# Patient Record
Sex: Female | Born: 1965
Health system: Southern US, Community
[De-identification: ages and names within clinical notes are randomized; demographics above are authoritative.]

## PROBLEM LIST (undated history)

## (undated) DIAGNOSIS — O00109 Unspecified tubal pregnancy without intrauterine pregnancy: Secondary | ICD-10-CM

## (undated) HISTORY — PX: ROTATOR CUFF REPAIR: SHX139

## (undated) HISTORY — PX: SALPINGECTOMY: SHX328

---

## 2000-12-10 ENCOUNTER — Other Ambulatory Visit: Admission: RE | Admit: 2000-12-10 | Discharge: 2000-12-10 | Payer: Self-pay | Admitting: Obstetrics and Gynecology

## 2002-02-06 ENCOUNTER — Other Ambulatory Visit: Admission: RE | Admit: 2002-02-06 | Discharge: 2002-02-06 | Payer: Self-pay | Admitting: Family Medicine

## 2003-07-02 ENCOUNTER — Emergency Department (HOSPITAL_COMMUNITY): Admission: EM | Admit: 2003-07-02 | Discharge: 2003-07-02 | Payer: Self-pay | Admitting: Family Medicine

## 2003-09-01 ENCOUNTER — Other Ambulatory Visit: Admission: RE | Admit: 2003-09-01 | Discharge: 2003-09-01 | Payer: Self-pay | Admitting: Obstetrics & Gynecology

## 2004-09-06 ENCOUNTER — Other Ambulatory Visit: Admission: RE | Admit: 2004-09-06 | Discharge: 2004-09-06 | Payer: Self-pay | Admitting: Obstetrics & Gynecology

## 2005-01-28 ENCOUNTER — Emergency Department (HOSPITAL_COMMUNITY): Admission: EM | Admit: 2005-01-28 | Discharge: 2005-01-28 | Payer: Self-pay | Admitting: Family Medicine

## 2005-07-09 ENCOUNTER — Emergency Department (HOSPITAL_COMMUNITY): Admission: EM | Admit: 2005-07-09 | Discharge: 2005-07-09 | Payer: Self-pay | Admitting: Family Medicine

## 2006-11-09 ENCOUNTER — Encounter (INDEPENDENT_AMBULATORY_CARE_PROVIDER_SITE_OTHER): Payer: Self-pay | Admitting: Obstetrics & Gynecology

## 2006-11-09 ENCOUNTER — Ambulatory Visit (HOSPITAL_COMMUNITY): Admission: RE | Admit: 2006-11-09 | Discharge: 2006-11-09 | Payer: Self-pay | Admitting: Obstetrics & Gynecology

## 2007-04-10 ENCOUNTER — Encounter: Admission: RE | Admit: 2007-04-10 | Discharge: 2007-04-10 | Payer: Self-pay | Admitting: Obstetrics & Gynecology

## 2008-01-31 ENCOUNTER — Emergency Department (HOSPITAL_COMMUNITY): Admission: EM | Admit: 2008-01-31 | Discharge: 2008-01-31 | Payer: Self-pay | Admitting: Family Medicine

## 2008-11-17 ENCOUNTER — Encounter: Admission: RE | Admit: 2008-11-17 | Discharge: 2008-11-17 | Payer: Self-pay | Admitting: Obstetrics & Gynecology

## 2009-04-09 ENCOUNTER — Emergency Department (HOSPITAL_COMMUNITY): Admission: EM | Admit: 2009-04-09 | Discharge: 2009-04-09 | Payer: Self-pay | Admitting: Emergency Medicine

## 2009-07-21 ENCOUNTER — Encounter: Admission: RE | Admit: 2009-07-21 | Discharge: 2009-07-21 | Payer: Self-pay | Admitting: Obstetrics & Gynecology

## 2010-09-27 NOTE — Op Note (Signed)
Holly Robertson, ANGEVINE               ACCOUNT NO.:  [id number]   MEDICAL RECORD NO.:  [id number]          PATIENT TYPE:  AMB   LOCATION:  SDC                           FACILITY:  [facility]   PHYSICIAN:  Ilda Mori, M.D.   DATE OF BIRTH:  30-Jul-1965   DATE OF PROCEDURE:  11/09/2006  DATE OF DISCHARGE:                               OPERATIVE REPORT   PREOPERATIVE DIAGNOSES:  Menorrhagia and submucous myoma.   POSTOPERATIVE DIAGNOSES:  Menorrhagia and submucous myoma.   PROCEDURE:  Hysteroscopic myomectomy and NovaSure endometrial ablation.   SURGEON:  Ilda Mori, M.D.   ANESTHESIA:  General.   ESTIMATED BLOOD LOSS:  Minimal.   SPECIMENS:  Tissue from submucous myoma.   FINDINGS:  The uterus sounded to 12 cm.  The cervix sounded to 4 cm.  The hysteroscopic evaluation revealed a 2 cm posterior submucous myoma  and external compression of the endometrium from the fundus.  The  endometrial tissue otherwise appeared normal.   INDICATIONS:  This is a 45 year old female, gravida 5, para 3-0-2-3, who  presents with several years of heavy vaginal bleeding that does not  respond well, even to oral contraceptives.  Sonohysterogram in the  office revealed a submucous myoma and this finding was discussed with  the patient and decision was made to proceed with hysteroscopy with  resection of the submucous myoma and endometrial ablation.   DESCRIPTION OF PROCEDURE:  The patient was taken to the operating room  and placed in the supine position and general anesthesia was induced.  She was then placed in the dorsal lithotomy position and the vagina and  perineum were prepped and draped in sterile fashion.  The anterior lip  of the cervix was grasped with a single-toothed tenaculum and 10 mL of  2% lidocaine was infiltrated into the paracervical tissues.  The cervix  was sounded to 4 cm with a Hegar dilator and the uterus was sounded to  12 cm.  The internal os was dilated with Shawnie Pons dilators  to a #25 [place].  A resectoscope was introduced into the endometrium and the submucous  myoma was identified and superficially resected, using cutting current  and a large loop electrode.  Care was made not to go into the  myometrium.  After the submucous myoma was resected down to the level of  the endometrium, the cavity was irrigated free of the Sorbitol solution  with lactated Ringer's solution.  The NovaSure device was placed at the  fundus and expanded to 4.2 cm and the ablation was carried out for  approximately 60 seconds.  The NovaSure was then  removed.  The diagnostic hysteroscope was introduced back into the  endometrial cavity and a satisfactory coagulation was noted to be  present.  The procedure was then terminated and the patient left the  operating room in good condition.      Ilda Mori, M.D.  Electronically Signed     RK/MEDQ  D:  11/09/2006  T:  11/09/2006  Job:  [id number]

## 2010-09-28 ENCOUNTER — Other Ambulatory Visit: Payer: Self-pay | Admitting: Obstetrics & Gynecology

## 2010-09-28 DIAGNOSIS — Z1231 Encounter for screening mammogram for malignant neoplasm of breast: Secondary | ICD-10-CM

## 2010-10-05 ENCOUNTER — Ambulatory Visit
Admission: RE | Admit: 2010-10-05 | Discharge: 2010-10-05 | Disposition: A | Discharge status: Home / Self Care | Payer: 59 | Source: Ambulatory Visit | Attending: Obstetrics & Gynecology | Admitting: Obstetrics & Gynecology

## 2010-10-05 DIAGNOSIS — Z1231 Encounter for screening mammogram for malignant neoplasm of breast: Secondary | ICD-10-CM

## 2010-10-06 ENCOUNTER — Other Ambulatory Visit: Payer: Self-pay | Admitting: Obstetrics & Gynecology

## 2010-10-06 DIAGNOSIS — R928 Other abnormal and inconclusive findings on diagnostic imaging of breast: Secondary | ICD-10-CM

## 2010-10-14 ENCOUNTER — Ambulatory Visit
Admission: RE | Admit: 2010-10-14 | Discharge: 2010-10-14 | Disposition: A | Discharge status: Home / Self Care | Payer: 59 | Source: Ambulatory Visit | Attending: Obstetrics & Gynecology | Admitting: Obstetrics & Gynecology

## 2010-10-14 DIAGNOSIS — R928 Other abnormal and inconclusive findings on diagnostic imaging of breast: Secondary | ICD-10-CM

## 2011-02-27 ENCOUNTER — Other Ambulatory Visit: Payer: Self-pay | Admitting: Emergency Medicine

## 2011-02-27 ENCOUNTER — Ambulatory Visit
Admission: RE | Admit: 2011-02-27 | Discharge: 2011-02-27 | Disposition: A | Discharge status: Home / Self Care | Payer: 59 | Source: Ambulatory Visit | Attending: Emergency Medicine | Admitting: Emergency Medicine

## 2011-02-27 DIAGNOSIS — G8929 Other chronic pain: Secondary | ICD-10-CM

## 2011-03-01 LAB — CBC
MCHC: 33.7
Platelets: 220
RDW: 12.5
WBC: 5.5

## 2011-05-02 ENCOUNTER — Other Ambulatory Visit: Payer: Self-pay | Admitting: Obstetrics & Gynecology

## 2011-05-02 DIAGNOSIS — N6310 Unspecified lump in the right breast, unspecified quadrant: Secondary | ICD-10-CM

## 2011-05-15 ENCOUNTER — Ambulatory Visit
Admission: RE | Admit: 2011-05-15 | Discharge: 2011-05-15 | Disposition: A | Discharge status: Home / Self Care | Payer: 59 | Source: Ambulatory Visit | Attending: Obstetrics & Gynecology | Admitting: Obstetrics & Gynecology

## 2011-05-15 DIAGNOSIS — N6310 Unspecified lump in the right breast, unspecified quadrant: Secondary | ICD-10-CM

## 2012-02-21 ENCOUNTER — Other Ambulatory Visit: Payer: Self-pay | Admitting: Obstetrics & Gynecology

## 2012-02-21 DIAGNOSIS — Z1231 Encounter for screening mammogram for malignant neoplasm of breast: Secondary | ICD-10-CM

## 2012-02-28 ENCOUNTER — Ambulatory Visit
Admission: RE | Admit: 2012-02-28 | Discharge: 2012-02-28 | Disposition: A | Discharge status: Home / Self Care | Payer: 59 | Source: Ambulatory Visit | Attending: Obstetrics & Gynecology | Admitting: Obstetrics & Gynecology

## 2012-02-28 DIAGNOSIS — Z1231 Encounter for screening mammogram for malignant neoplasm of breast: Secondary | ICD-10-CM

## 2012-08-06 ENCOUNTER — Other Ambulatory Visit: Payer: Self-pay

## 2012-12-21 ENCOUNTER — Emergency Department (HOSPITAL_COMMUNITY)
Admission: EM | Admit: 2012-12-21 | Discharge: 2012-12-21 | Disposition: A | Discharge status: Home / Self Care | Payer: 59 | Source: Home / Self Care | Attending: Family Medicine | Admitting: Family Medicine

## 2012-12-21 ENCOUNTER — Encounter (HOSPITAL_COMMUNITY): Payer: Self-pay | Admitting: Emergency Medicine

## 2012-12-21 DIAGNOSIS — S39012A Strain of muscle, fascia and tendon of lower back, initial encounter: Secondary | ICD-10-CM

## 2012-12-21 DIAGNOSIS — S335XXA Sprain of ligaments of lumbar spine, initial encounter: Secondary | ICD-10-CM

## 2012-12-21 DIAGNOSIS — M5431 Sciatica, right side: Secondary | ICD-10-CM

## 2012-12-21 DIAGNOSIS — M5430 Sciatica, unspecified side: Secondary | ICD-10-CM

## 2012-12-21 LAB — POCT URINALYSIS DIP (DEVICE): Nitrite: NEGATIVE

## 2012-12-21 MED ORDER — TRAMADOL HCL 50 MG PO TABS: 50.0000 mg | ORAL_TABLET | Freq: Four times a day (QID) | ORAL | Status: DC | PRN

## 2012-12-21 MED ORDER — IBUPROFEN 600 MG PO TABS: 600.0000 mg | ORAL_TABLET | Freq: Three times a day (TID) | ORAL | Status: DC

## 2012-12-21 MED ORDER — CYCLOBENZAPRINE HCL 10 MG PO TABS: 10.0000 mg | ORAL_TABLET | Freq: Two times a day (BID) | ORAL | Status: DC | PRN

## 2012-12-21 MED ORDER — PREDNISONE 20 MG PO TABS: ORAL_TABLET | ORAL | Status: DC

## 2012-12-21 NOTE — ED Notes (Signed)
Pt c/o right lower back pain that radiates down to right leg onset 1 week sxs include: tingly/numbness; pain increases when lying down or getting out of bed... Throughout the day she is fine but doesn't know if it's the aleve/ibup she's taking Denies: inj/trauma, strenuous activity, urinary sxs, abd pain Alert w/no signs of acute distress.

## 2012-12-21 NOTE — ED Provider Notes (Signed)
And CSN: [id number]     Arrival date & time 12/21/12  4098 History     First MD Initiated Contact with Patient 12/21/12 0957     Chief Complaint  Patient presents with  . Back Pain   (Consider location/radiation/quality/duration/timing/severity/associated sxs/prior Treatment) HPI Comments: 47 year old female with no significant past medical history here complaining of low back pain with radiation towards the right gluteal area and right lateral thigh for one week. Symptoms associated with a tingling sensation in her lateral right thigh especially after laying down or when she stands up from bed or from prolonged sitting. 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 Denies weakness or numbness in her lower extremities. Denies urinary symptoms like frequency, hematuria or incontinence. Has taken in inconsistent over-the-counter Advil with improvement of her pain during daytime. Denies direct known trauma or recent falls.   History reviewed. No pertinent past medical history. Past Surgical History  Procedure Laterality Date  . Rotator cuff repair Left    No family history on file. History  Substance Use Topics  . Smoking status: Never Smoker   . Smokeless tobacco: Not on file  . Alcohol Use: No   OB History   Grav Para Term Preterm Abortions TAB SAB Ect Mult Living                 Review of Systems  Constitutional: Negative for fever, chills, diaphoresis, appetite change and fatigue.  Gastrointestinal: Negative for nausea, vomiting and abdominal pain.  Genitourinary: Negative for dysuria, hematuria, flank pain and pelvic pain.  Musculoskeletal: Positive for back pain.  Skin: Negative for rash.  Neurological: Negative for dizziness, weakness, numbness and headaches.  All other systems reviewed and are negative.    Allergies  Septra  Home Medications   Current Outpatient Rx  Name  Route  Sig  Dispense  Refill  . cyclobenzaprine  (FLEXERIL) 10 MG tablet   Oral   Take 1 tablet (10 mg total) by mouth 2 (two) times daily as needed for muscle spasms.   20 tablet   0   . ibuprofen (ADVIL,MOTRIN) 600 MG tablet   Oral   Take 1 tablet (600 mg total) by mouth 3 (three) times daily. Take with meals   30 tablet   0   . predniSONE (DELTASONE) 20 MG tablet      2 tabs by mouth daily for 5 days   10 tablet   0   . traMADol (ULTRAM) 50 MG tablet   Oral   Take 1 tablet (50 mg total) by mouth every 6 (six) hours as needed for pain.   20 tablet   0    BP 137/91  Pulse 66  Temp(Src) 100 F (37.8 C) (Oral)  Resp 16  SpO2 98%  LMP 11/21/2012 Physical Exam  Nursing note and vitals reviewed. Constitutional: She is oriented to person, place, and time. She appears well-developed and well-nourished. No distress.  HENT:  Head: Normocephalic and atraumatic.  Eyes: Conjunctivae are normal.  Cardiovascular: Normal heart sounds.   Pulmonary/Chest: Breath sounds normal.  Abdominal: Soft. She exhibits no mass. There is no tenderness.  Musculoskeletal:  Central spine with no scoliosis or kyphosis. Fair anterior, flexion and posterior extension. Patient able to walk on tip toes and heels with minimal discomfort but no foot drop, no pain exacerbation. No bone prominence tenderness. Tenderness to palpation in bilateral lumbar paravertebral muscles, worse in right side. Also pain  with deep palpation over SI joint.   Negative straight leg test bilateral. Intact sensation and symmetric + DTRs (rotullian and achillean) in low extremities.   Neurological: She is alert and oriented to person, place, and time.  Skin: No rash noted. She is not diaphoretic.    ED Course   Procedures (including critical care time)  Labs Reviewed  POCT URINALYSIS DIP (DEVICE) - Abnormal; Notable for the following:    Hgb urine dipstick MODERATE (*)    All other components within normal limits   No results found. 1. Low back strain, initial  encounter   2. Sciatica, right     MDM  Treated with prednisone, Flexeril, ibuprofen and tramadol.  Moderate hematuria related to menstrual period otherwise normal poc UA. Supportive care including rehabilitation exercises and red flags that should prompt her return to medical attention discussed with patient and provided in writing.   Sharin Grave, MD 12/21/12 1102

## 2013-01-02 ENCOUNTER — Ambulatory Visit
Admission: RE | Admit: 2013-01-02 | Discharge: 2013-01-02 | Disposition: A | Discharge status: Home / Self Care | Payer: 59 | Source: Ambulatory Visit | Attending: Physician Assistant | Admitting: Physician Assistant

## 2013-01-02 ENCOUNTER — Other Ambulatory Visit: Payer: Self-pay | Admitting: Physician Assistant

## 2013-01-02 DIAGNOSIS — R52 Pain, unspecified: Secondary | ICD-10-CM

## 2013-11-18 ENCOUNTER — Other Ambulatory Visit: Payer: Self-pay

## 2013-11-18 DIAGNOSIS — Z1231 Encounter for screening mammogram for malignant neoplasm of breast: Secondary | ICD-10-CM

## 2013-11-24 ENCOUNTER — Ambulatory Visit
Admission: RE | Admit: 2013-11-24 | Discharge: 2013-11-24 | Disposition: A | Discharge status: Home / Self Care | Payer: 59 | Source: Ambulatory Visit

## 2013-11-24 ENCOUNTER — Encounter (INDEPENDENT_AMBULATORY_CARE_PROVIDER_SITE_OTHER): Payer: Self-pay

## 2013-11-24 DIAGNOSIS — Z1231 Encounter for screening mammogram for malignant neoplasm of breast: Secondary | ICD-10-CM

## 2014-03-07 ENCOUNTER — Emergency Department (HOSPITAL_COMMUNITY)
Admission: EM | Admit: 2014-03-07 | Discharge: 2014-03-07 | Disposition: A | Discharge status: Home / Self Care | Payer: 59 | Source: Home / Self Care | Attending: Emergency Medicine | Admitting: Emergency Medicine

## 2014-03-07 ENCOUNTER — Encounter (HOSPITAL_COMMUNITY): Payer: Self-pay | Admitting: Emergency Medicine

## 2014-03-07 DIAGNOSIS — M5441 Lumbago with sciatica, right side: Secondary | ICD-10-CM

## 2014-03-07 HISTORY — DX: Unspecified tubal pregnancy without intrauterine pregnancy: O00.109

## 2014-03-07 MED ORDER — CYCLOBENZAPRINE HCL 10 MG PO TABS: 10.0000 mg | ORAL_TABLET | Freq: Three times a day (TID) | ORAL | Status: DC | PRN

## 2014-03-07 MED ORDER — PREDNISONE 20 MG PO TABS: ORAL_TABLET | ORAL | Status: DC

## 2014-03-07 MED ORDER — MELOXICAM 7.5 MG PO TABS: 7.5000 mg | ORAL_TABLET | Freq: Every day | ORAL | Status: DC

## 2014-03-07 NOTE — Discharge Instructions (Signed)
Take prednisone 2 pills daily for 5 days. Take meloxicam 1 pill daily for 2 weeks, then as needed.  Do not take with ibuprofen, advil, aleve. Take flexeril 1 pill every 8 hours as needed. Continue to do the stretches and exercises.  Follow up with your orthopedic doctor if no improvement in 2-4 weeks. If you develop weakness that does not improve during the day or trouble controlling your bowel/bladder, please go to the ER.

## 2014-03-07 NOTE — ED Notes (Signed)
C/o low back pain, right worse than left , pain does go down right leg and into foot.  Has had numbness in bottom of foot.  The episodes of numbness in right foot have occurred x 1 month.  Pain in back into right leg has been x 1 year.

## 2014-03-07 NOTE — ED Provider Notes (Signed)
CSN: [id number]     Arrival date & time 03/07/14  1510 History   First MD Initiated Contact with Patient 03/07/14 1516     Chief Complaint  Patient presents with  . Back Pain   (Consider location/radiation/quality/duration/timing/severity/associated sxs/prior Treatment) HPI She's a 48 year old woman here for evaluation of right-sided back pain. This pain has been present for about one year. It is located in the right upper back with radiation down the right leg to the foot. The last month or so, she has also noticed some intermittent numbness in the bottom of the right foot. She states her symptoms are worse first thing in the morning, and improve as the day goes on. She describes some weakness in the right leg in the mornings, but this is completely resolved by the afternoon. It is worse if she stands with her back in slight flexion. She has done physical therapy in the past, and thinks the stretching exercises may be helpful little bit. She was seen for this same pain a little over a year ago. At that time, she saw an orthopedic surgeon and had an MRI done. She was diagnosed with L5 disc disease. Epidural injections were discussed at that time, however she declined. She was treated with muscle relaxers and prednisone, which she states helped some.  Past Medical History  Diagnosis Date  . Tubal pregnancy    Past Surgical History  Procedure Laterality Date  . Rotator cuff repair Left    No family history on file. History  Substance Use Topics  . Smoking status: Never Smoker   . Smokeless tobacco: Not on file  . Alcohol Use: No   OB History   Grav Para Term Preterm Abortions TAB SAB Ect Mult Living                 Review of Systems  Genitourinary: Negative for difficulty urinating.  Musculoskeletal: Positive for back pain.  All other systems reviewed and are negative.   Allergies  Septra  Home Medications   Prior to Admission medications   Medication Sig Start Date End Date  Taking? Authorizing Provider  cyclobenzaprine (FLEXERIL) 10 MG tablet Take 1 tablet (10 mg total) by mouth 3 (three) times daily as needed for muscle spasms. 03/07/14   Charm RingsErin J Honig, MD  ibuprofen (ADVIL,MOTRIN) 600 MG tablet Take 1 tablet (600 mg total) by mouth 3 (three) times daily. Take with meals 12/21/12   Adlih Moreno-Coll, MD  meloxicam (MOBIC) 7.5 MG tablet Take 1 tablet (7.5 mg total) by mouth daily. 03/07/14   Charm RingsErin J Honig, MD  predniSONE (DELTASONE) 20 MG tablet 2 tabs by mouth daily for 5 days 03/07/14   Charm RingsErin J Honig, MD  traMADol (ULTRAM) 50 MG tablet Take 1 tablet (50 mg total) by mouth every 6 (six) hours as needed for pain. 12/21/12   Adlih Moreno-Coll, MD   BP 146/84  Pulse 70  Temp(Src) 97.8 F (36.6 C) (Oral)  Resp 12  SpO2 100%  LMP 02/21/2014 Physical Exam  Constitutional: She is oriented to person, place, and time. She appears well-developed and well-nourished. No distress.  Cardiovascular: Normal rate.   Pulmonary/Chest: Effort normal.  Musculoskeletal:  Back: no erythema or edema.  No vertebral tenderness or step-offs.  No point tenderness over SI joints or piriformis. Seated SLR negative bilaterally.  5/5 strength in hip flexion, quads, hamstrings, dorsiflexion and plantar flexion.  Neurological: She is alert and oriented to person, place, and time. She has normal strength. She exhibits  normal muscle tone.  Reflex Scores:      Patellar reflexes are 2+ on the right side and 2+ on the left side.      Achilles reflexes are 2+ on the right side and 2+ on the left side. Skin: Skin is warm and dry.    ED Course  Procedures (including critical care time) Labs Review Labs Reviewed - No data to display  Imaging Review No results found.   MDM   1. Right-sided low back pain with right-sided sciatica    Discussed diagnosis with patient. Will treat with prednisone, meloxicam, Flexeril. Recommended continuing the back exercises and stretching exercises. If no  improvement in the next 2-4 weeks, would recommend followup with orthopedic doctor to discuss epidural injection. Warning signs reviewed as in after visit summary.    Charm RingsErin J Honig, MD 03/07/14 [phone]

## 2016-07-26 DIAGNOSIS — Z0000 Encounter for general adult medical examination without abnormal findings: Secondary | ICD-10-CM | POA: Diagnosis not present

## 2016-09-14 ENCOUNTER — Other Ambulatory Visit: Payer: Self-pay | Admitting: Obstetrics & Gynecology

## 2016-09-14 DIAGNOSIS — Z01419 Encounter for gynecological examination (general) (routine) without abnormal findings: Secondary | ICD-10-CM | POA: Diagnosis not present

## 2016-09-14 DIAGNOSIS — Z1231 Encounter for screening mammogram for malignant neoplasm of breast: Secondary | ICD-10-CM | POA: Diagnosis not present

## 2016-09-14 DIAGNOSIS — Z124 Encounter for screening for malignant neoplasm of cervix: Secondary | ICD-10-CM | POA: Diagnosis not present

## 2016-09-15 ENCOUNTER — Other Ambulatory Visit: Payer: Self-pay | Admitting: Obstetrics & Gynecology

## 2016-09-15 DIAGNOSIS — R928 Other abnormal and inconclusive findings on diagnostic imaging of breast: Secondary | ICD-10-CM

## 2016-09-18 LAB — CYTOLOGY - PAP

## 2016-09-26 ENCOUNTER — Other Ambulatory Visit: Payer: Self-pay

## 2016-09-28 ENCOUNTER — Encounter: Payer: Self-pay | Admitting: Radiology

## 2016-09-28 ENCOUNTER — Ambulatory Visit
Admission: RE | Admit: 2016-09-28 | Discharge: 2016-09-28 | Disposition: A | Discharge status: Home / Self Care | Payer: Self-pay | Source: Ambulatory Visit | Attending: Obstetrics & Gynecology | Admitting: Obstetrics & Gynecology

## 2016-09-28 ENCOUNTER — Ambulatory Visit
Admission: RE | Admit: 2016-09-28 | Discharge: 2016-09-28 | Disposition: A | Discharge status: Home / Self Care | Payer: Commercial Managed Care - HMO | Source: Ambulatory Visit | Attending: Obstetrics & Gynecology | Admitting: Obstetrics & Gynecology

## 2016-09-28 DIAGNOSIS — R928 Other abnormal and inconclusive findings on diagnostic imaging of breast: Secondary | ICD-10-CM

## 2016-09-28 DIAGNOSIS — R922 Inconclusive mammogram: Secondary | ICD-10-CM | POA: Diagnosis not present

## 2016-11-10 DIAGNOSIS — Z0100 Encounter for examination of eyes and vision without abnormal findings: Secondary | ICD-10-CM | POA: Diagnosis not present

## 2017-02-20 ENCOUNTER — Other Ambulatory Visit: Payer: Self-pay | Admitting: Physician Assistant

## 2017-02-20 ENCOUNTER — Ambulatory Visit
Admission: RE | Admit: 2017-02-20 | Discharge: 2017-02-20 | Disposition: A | Discharge status: Home / Self Care | Payer: 59 | Source: Ambulatory Visit | Attending: Physician Assistant | Admitting: Physician Assistant

## 2017-02-20 DIAGNOSIS — R52 Pain, unspecified: Secondary | ICD-10-CM

## 2017-02-20 DIAGNOSIS — M2550 Pain in unspecified joint: Secondary | ICD-10-CM | POA: Diagnosis not present

## 2017-02-20 DIAGNOSIS — M189 Osteoarthritis of first carpometacarpal joint, unspecified: Secondary | ICD-10-CM | POA: Diagnosis not present

## 2017-03-13 DIAGNOSIS — M1811 Unilateral primary osteoarthritis of first carpometacarpal joint, right hand: Secondary | ICD-10-CM | POA: Diagnosis not present

## 2017-03-13 DIAGNOSIS — M1812 Unilateral primary osteoarthritis of first carpometacarpal joint, left hand: Secondary | ICD-10-CM | POA: Diagnosis not present

## 2017-05-03 DIAGNOSIS — M1812 Unilateral primary osteoarthritis of first carpometacarpal joint, left hand: Secondary | ICD-10-CM | POA: Diagnosis not present

## 2017-05-03 DIAGNOSIS — M1811 Unilateral primary osteoarthritis of first carpometacarpal joint, right hand: Secondary | ICD-10-CM | POA: Diagnosis not present

## 2017-07-30 DIAGNOSIS — Z0000 Encounter for general adult medical examination without abnormal findings: Secondary | ICD-10-CM | POA: Diagnosis not present

## 2017-07-30 DIAGNOSIS — Z13220 Encounter for screening for lipoid disorders: Secondary | ICD-10-CM | POA: Diagnosis not present

## 2017-10-10 DIAGNOSIS — Z1231 Encounter for screening mammogram for malignant neoplasm of breast: Secondary | ICD-10-CM | POA: Diagnosis not present

## 2017-10-10 DIAGNOSIS — Z01419 Encounter for gynecological examination (general) (routine) without abnormal findings: Secondary | ICD-10-CM | POA: Diagnosis not present

## 2017-11-26 ENCOUNTER — Encounter (HOSPITAL_COMMUNITY): Payer: Self-pay | Admitting: Emergency Medicine

## 2017-11-26 ENCOUNTER — Other Ambulatory Visit: Payer: Self-pay

## 2017-11-26 ENCOUNTER — Ambulatory Visit (HOSPITAL_COMMUNITY)
Admission: EM | Admit: 2017-11-26 | Discharge: 2017-11-26 | Disposition: A | Discharge status: Home / Self Care | Payer: 59 | Attending: Family Medicine | Admitting: Family Medicine

## 2017-11-26 DIAGNOSIS — Z882 Allergy status to sulfonamides status: Secondary | ICD-10-CM | POA: Insufficient documentation

## 2017-11-26 DIAGNOSIS — R300 Dysuria: Secondary | ICD-10-CM

## 2017-11-26 DIAGNOSIS — R109 Unspecified abdominal pain: Secondary | ICD-10-CM | POA: Diagnosis present

## 2017-11-26 LAB — POCT URINALYSIS DIP (DEVICE)
Bilirubin Urine: NEGATIVE
Glucose, UA: NEGATIVE mg/dL
Ketones, ur: NEGATIVE mg/dL
Leukocytes, UA: NEGATIVE
NITRITE: NEGATIVE
PH: 7.5 (ref 5.0–8.0)
Protein, ur: NEGATIVE mg/dL
Specific Gravity, Urine: 1.015 (ref 1.005–1.030)
UROBILINOGEN UA: 1 mg/dL (ref 0.0–1.0)

## 2017-11-26 MED ORDER — NITROFURANTOIN MONOHYD MACRO 100 MG PO CAPS: 100.0000 mg | ORAL_CAPSULE | Freq: Two times a day (BID) | ORAL | 0 refills | Status: AC

## 2017-11-26 MED ORDER — IBUPROFEN 800 MG PO TABS: 800.0000 mg | ORAL_TABLET | Freq: Three times a day (TID) | ORAL | 0 refills | Status: DC

## 2017-11-26 NOTE — ED Provider Notes (Signed)
[facility]    CSN: [id number] Arrival date & time: 11/26/17  1629     History   Chief Complaint Chief Complaint  Patient presents with  . Urinary Tract Infection    HPI Holly Robertson is a 52 y.o. female.   Holly Robertson presents with complaints of burning with urination as well as some left sided abdominal pain. Started originally 7/12, took azo and cranberry juice so had improved somewhat. Last night and today symptoms worsened. Does have some urinary frequency. States she feels she has inadequate emptying of bladder. No back pain. No fevers. No nausea or vomiting. No vaginal symptoms. Pain to abdomen does radiate to flank occasionally. No current pain. States had a UTI years ago but not regularly. No history of kidney stones. Without contributing medical history.     ROS per HPI.      Past Medical History:  Diagnosis Date  . Tubal pregnancy     There are no active problems to display for this patient.   Past Surgical History:  Procedure Laterality Date  . ROTATOR CUFF REPAIR Left     OB History   None      Home Medications    Prior to Admission medications   Medication Sig Start Date End Date Taking? Authorizing Provider  ibuprofen (ADVIL,MOTRIN) 800 MG tablet Take 1 tablet (800 mg total) by mouth 3 (three) times daily. 11/26/17   Georgetta Haber, NP  nitrofurantoin, macrocrystal-monohydrate, (MACROBID) 100 MG capsule Take 1 capsule (100 mg total) by mouth 2 (two) times daily for 5 days. 11/26/17 12/01/17  Georgetta Haber, NP    Family History Family History  Problem Relation Age of Onset  . Diabetes Mother   . Hypertension Mother   . Hyperlipidemia Mother   . Cirrhosis Mother   . Sarcoidosis Mother     Social History Social History   Tobacco Use  . Smoking status: Never Smoker  Substance Use Topics  . Alcohol use: No  . Drug use: No     Allergies   Septra [sulfamethoxazole-trimethoprim]   Review of Systems Review of  Systems   Physical Exam Triage Vital Signs ED Triage Vitals  Enc Vitals Group     BP 11/26/17 1658 (!) 153/88     Pulse Rate 11/26/17 1658 60     Resp 11/26/17 1658 18     Temp 11/26/17 1658 98.1 F (36.7 C)     Temp Source 11/26/17 1658 Oral     SpO2 11/26/17 1658 100 %     Weight --      Height --      Head Circumference --      Peak Flow --      Pain Score 11/26/17 1655 9     Pain Loc --      Pain Edu? --      Excl. in GC? --    No data found.  Updated Vital Signs BP (!) 153/88 (BP Location: Left Arm)   Pulse 60   Temp 98.1 F (36.7 C) (Oral)   Resp 18   SpO2 100%    Physical Exam  Constitutional: She is oriented to person, place, and time. She appears well-developed and well-nourished. No distress.  Cardiovascular: Normal rate, regular rhythm and normal heart sounds.  Pulmonary/Chest: Effort normal and breath sounds normal.  Abdominal: Soft. Bowel sounds are normal. There is no tenderness. There is no rigidity, no rebound, no guarding, no CVA tenderness, no tenderness at McBurney's point and  negative Murphy's sign.  States that she "noticed" left CVA on palpation but denies it being painful, no objective indication of pain.   Neurological: She is alert and oriented to person, place, and time.  Skin: Skin is warm and dry.     UC Treatments / Results  Labs (all labs ordered are listed, but only abnormal results are displayed) Labs Reviewed  POCT URINALYSIS DIP (DEVICE) - Abnormal; Notable for the following components:      Result Value   Hgb urine dipstick TRACE (*)    All other components within normal limits  URINE CULTURE    EKG None  Radiology No results found.  Procedures Procedures (including critical care time)  Medications Ordered in UC Medications - No data to display  Initial Impression / Assessment and Plan / UC Course  I have reviewed the triage vital signs and the nursing notes.  Pertinent labs & imaging results that were available  during my care of the patient were reviewed by me and considered in my medical decision making (see chart for details).     Non toxic non distress in appearance. Benign physical exam. UTI symptoms. hgb in urine, no leuks or nitrite. Culture pending. macrobid initiated. Discussed small kidney stone as differential. Push fluid intake. Ibuprofen for pain control. Discussed follow up if not improving or worsening. Patient verbalized understanding and agreeable to plan.    Final Clinical Impressions(s) / UC Diagnoses   Final diagnoses:  Dysuria     Discharge Instructions     Drink plenty of water to empty bladder regularly. Avoid alcohol and caffeine as these may irritate the bladder.   Complete course of antibiotics.  I have sent the urine to be cultured, we will call you if there are any changes to treatment indicated with results. If develop increased pain, fevers, nausea or vomiting please go to the Er. If symptoms worsen or do not improve in the next week to return to be seen or to follow up with your PCP.     ED Prescriptions    Medication Sig Dispense Auth. Provider   ibuprofen (ADVIL,MOTRIN) 800 MG tablet Take 1 tablet (800 mg total) by mouth 3 (three) times daily. 21 tablet Linus MakoBurky, Nadiya Pieratt B, NP   nitrofurantoin, macrocrystal-monohydrate, (MACROBID) 100 MG capsule Take 1 capsule (100 mg total) by mouth 2 (two) times daily for 5 days. 10 capsule Georgetta HaberBurky, Dickson Kostelnik B, NP     Controlled Substance Prescriptions [place] Controlled Substance Registry consulted? Not Applicable   Georgetta HaberBurky, Demyan Fugate B, NP 11/26/17 1752

## 2017-11-26 NOTE — Discharge Instructions (Signed)
Drink plenty of water to empty bladder regularly. Avoid alcohol and caffeine as these may irritate the bladder.   Complete course of antibiotics.  I have sent the urine to be cultured, we will call you if there are any changes to treatment indicated with results. If develop increased pain, fevers, nausea or vomiting please go to the Er. If symptoms worsen or do not improve in the next week to return to be seen or to follow up with your PCP.

## 2017-11-26 NOTE — ED Triage Notes (Signed)
Burning at the end of urinary stream, lower abdominal pain.  Onset Thursday of symptoms and started azo.  Symptoms seemed to resolve, but returned "full blown" today

## 2017-11-27 LAB — URINE CULTURE: Culture: <10000 — AB

## 2018-06-12 ENCOUNTER — Ambulatory Visit (HOSPITAL_COMMUNITY)
Admission: EM | Admit: 2018-06-12 | Discharge: 2018-06-12 | Disposition: A | Discharge status: Home / Self Care | Payer: 59 | Attending: Family Medicine | Admitting: Family Medicine

## 2018-06-12 ENCOUNTER — Encounter (HOSPITAL_COMMUNITY): Payer: Self-pay

## 2018-06-12 DIAGNOSIS — W57XXXA Bitten or stung by nonvenomous insect and other nonvenomous arthropods, initial encounter: Secondary | ICD-10-CM | POA: Diagnosis not present

## 2018-06-12 DIAGNOSIS — S80862A Insect bite (nonvenomous), left lower leg, initial encounter: Secondary | ICD-10-CM | POA: Diagnosis not present

## 2018-06-12 MED ORDER — CEPHALEXIN 500 MG PO CAPS: 500.0000 mg | ORAL_CAPSULE | Freq: Four times a day (QID) | ORAL | 0 refills | Status: AC

## 2018-06-12 MED ORDER — TRIAMCINOLONE ACETONIDE 0.1 % EX CREA
1.0000 | TOPICAL_CREAM | Freq: Two times a day (BID) | CUTANEOUS | 0 refills | Status: DC
Start: 2018-06-12 — End: 2019-08-02

## 2018-06-12 NOTE — ED Triage Notes (Signed)
Pt c/o ?bug bite to lt inner thigh area x2days, redness and swelling noted

## 2018-06-12 NOTE — Discharge Instructions (Signed)
Please begin taking Keflex 4 times a day for the next week Please monitor the redness swelling and pain that you are having, please monitor for this to gradually resolve as you are taking the antibiotic May apply Kenalog cream twice daily to the area to help with swelling and inflammation  Please follow-up if redness worsening, developing drainage, numbness or tingling, skin change in color, developing fever, weakness in leg

## 2018-06-13 NOTE — ED Provider Notes (Signed)
[facility]    CSN: [id number] Arrival date & time: 06/12/18  1708     History   Chief Complaint Chief Complaint  Patient presents with  . Insect Bite    HPI Harold HedgeGinny Donaghy is a 53 y.o. female no significant past medical history presenting today for evaluation of a bug bite.  Patient states that over the past 2 to 3 days she has had increased redness and swelling and pain around an area she believes to be a bug bite.  She denies seeing the specific bug or spider.  Has noted increased redness.  She has not used anything for her symptoms.  Has had a mild headache, but denies fevers.  Denies numbness or tingling.  HPI  Past Medical History:  Diagnosis Date  . Tubal pregnancy     There are no active problems to display for this patient.   Past Surgical History:  Procedure Laterality Date  . ROTATOR CUFF REPAIR Left     OB History   No obstetric history on file.      Home Medications    Prior to Admission medications   Medication Sig Start Date End Date Taking? Authorizing Provider  cephALEXin (KEFLEX) 500 MG capsule Take 1 capsule (500 mg total) by mouth 4 (four) times daily for 7 days. 06/12/18 06/19/18  Wieters, Hallie C, PA-C  ibuprofen (ADVIL,MOTRIN) 800 MG tablet Take 1 tablet (800 mg total) by mouth 3 (three) times daily. 11/26/17   Linus MakoBurky, Natalie B, NP  triamcinolone cream (KENALOG) 0.1 % Apply 1 application topically 2 (two) times daily. 06/12/18   Wieters, Junius CreamerHallie C, PA-C    Family History Family History  Problem Relation Age of Onset  . Diabetes Mother   . Hypertension Mother   . Hyperlipidemia Mother   . Cirrhosis Mother   . Sarcoidosis Mother     Social History Social History   Tobacco Use  . Smoking status: Never Smoker  . Smokeless tobacco: Never Used  Substance Use Topics  . Alcohol use: No  . Drug use: No     Allergies   Septra [sulfamethoxazole-trimethoprim]   Review of Systems Review of Systems  Constitutional: Negative  for fatigue and fever.  Eyes: Negative for visual disturbance.  Respiratory: Negative for shortness of breath.   Cardiovascular: Negative for chest pain.  Gastrointestinal: Negative for abdominal pain, nausea and vomiting.  Musculoskeletal: Negative for arthralgias and joint swelling.  Skin: Positive for color change and rash. Negative for wound.  Neurological: Negative for dizziness, weakness, light-headedness and headaches.     Physical Exam Triage Vital Signs ED Triage Vitals  Enc Vitals Group     BP 06/12/18 1753 (!) 134/94     Pulse Rate 06/12/18 1753 (!) 57     Resp 06/12/18 1753 18     Temp 06/12/18 1753 98.2 F (36.8 C)     Temp Source 06/12/18 1753 Oral     SpO2 06/12/18 1753 98 %     Weight --      Height --      Head Circumference --      Peak Flow --      Pain Score 06/12/18 1754 6     Pain Loc --      Pain Edu? --      Excl. in GC? --    No data found.  Updated Vital Signs BP (!) 134/94 (BP Location: Right Arm)   Pulse (!) 57   Temp 98.2 F (36.8 C) (  Oral)   Resp 18   LMP 02/21/2014   SpO2 98%   Visual Acuity Right Eye Distance:   Left Eye Distance:   Bilateral Distance:    Right Eye Near:   Left Eye Near:    Bilateral Near:     Physical Exam Vitals signs and nursing note reviewed.  Constitutional:      Appearance: She is well-developed.     Comments: No acute distress  HENT:     Head: Normocephalic and atraumatic.     Nose: Nose normal.  Eyes:     Conjunctiva/sclera: Conjunctivae normal.  Neck:     Musculoskeletal: Neck supple.  Cardiovascular:     Rate and Rhythm: Normal rate.  Pulmonary:     Effort: Pulmonary effort is normal. No respiratory distress.  Abdominal:     General: There is no distension.  Musculoskeletal: Normal range of motion.  Skin:    General: Skin is warm and dry.     Comments: See picture below-4 cm x 4-1/2 cm circular area of erythema with central punctate lesion, streaking of erythema extending more proximally   Neurological:     Mental Status: She is alert and oriented to person, place, and time.        UC Treatments / Results  Labs (all labs ordered are listed, but only abnormal results are displayed) Labs Reviewed - No data to display  EKG None  Radiology No results found.  Procedures Procedures (including critical care time)  Medications Ordered in UC Medications - No data to display  Initial Impression / Assessment and Plan / UC Course  I have reviewed the triage vital signs and the nursing notes.  Pertinent labs & imaging results that were available during my care of the patient were reviewed by me and considered in my medical decision making (see chart for details).     Patient appears to have some form of bug or spider bite, given redness, swelling and streaking of erythema will initiate on antibiotics to cover for cellulitis.  Beginning on Keflex, also provided Kenalog cream to apply twice daily to help with any swelling and local inflammation from this as well.  Advised to monitor redness and streaking, follow-up if symptoms progressing and worsening.Discussed strict return precautions. Patient verbalized understanding and is agreeable with plan.  Final Clinical Impressions(s) / UC Diagnoses   Final diagnoses:  Bug bite with infection, initial encounter     Discharge Instructions     Please begin taking Keflex 4 times a day for the next week Please monitor the redness swelling and pain that you are having, please monitor for this to gradually resolve as you are taking the antibiotic May apply Kenalog cream twice daily to the area to help with swelling and inflammation  Please follow-up if redness worsening, developing drainage, numbness or tingling, skin change in color, developing fever, weakness in leg   ED Prescriptions    Medication Sig Dispense Auth. Provider   cephALEXin (KEFLEX) 500 MG capsule Take 1 capsule (500 mg total) by mouth 4 (four) times daily  for 7 days. 28 capsule Wieters, Hallie C, PA-C   triamcinolone cream (KENALOG) 0.1 % Apply 1 application topically 2 (two) times daily. 30 g Wieters, [place]fieldHallie C, PA-C     Controlled Substance Prescriptions [place] Controlled Substance Registry consulted? Not Applicable   Lew DawesWieters, Hallie C, [place] JerseyPA-C 06/13/18 1248

## 2018-10-05 IMAGING — CR DG FINGER THUMB 2+V*R*
3 series · 3 of 3 positions shown · non-contrast
Comparison: None.

CLINICAL DATA: Chronic bilateral thumb pain

EXAM:
RIGHT THUMB 2+V

[x finger pa right]
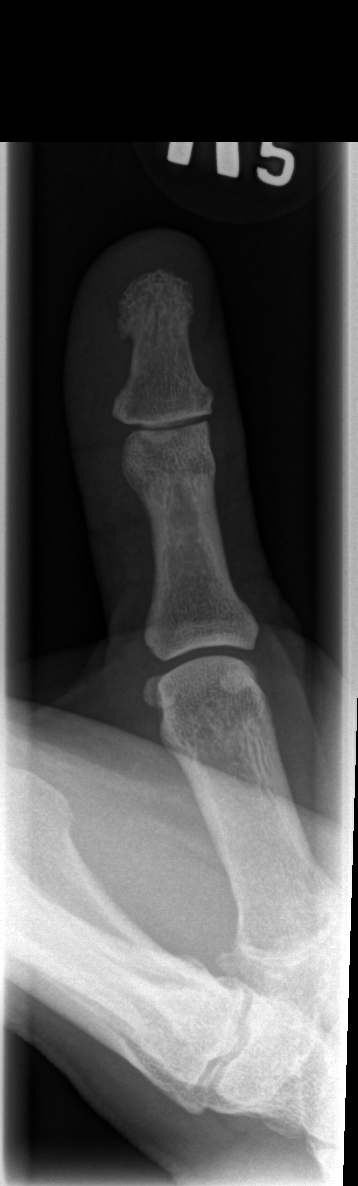

[x finger obl. right]
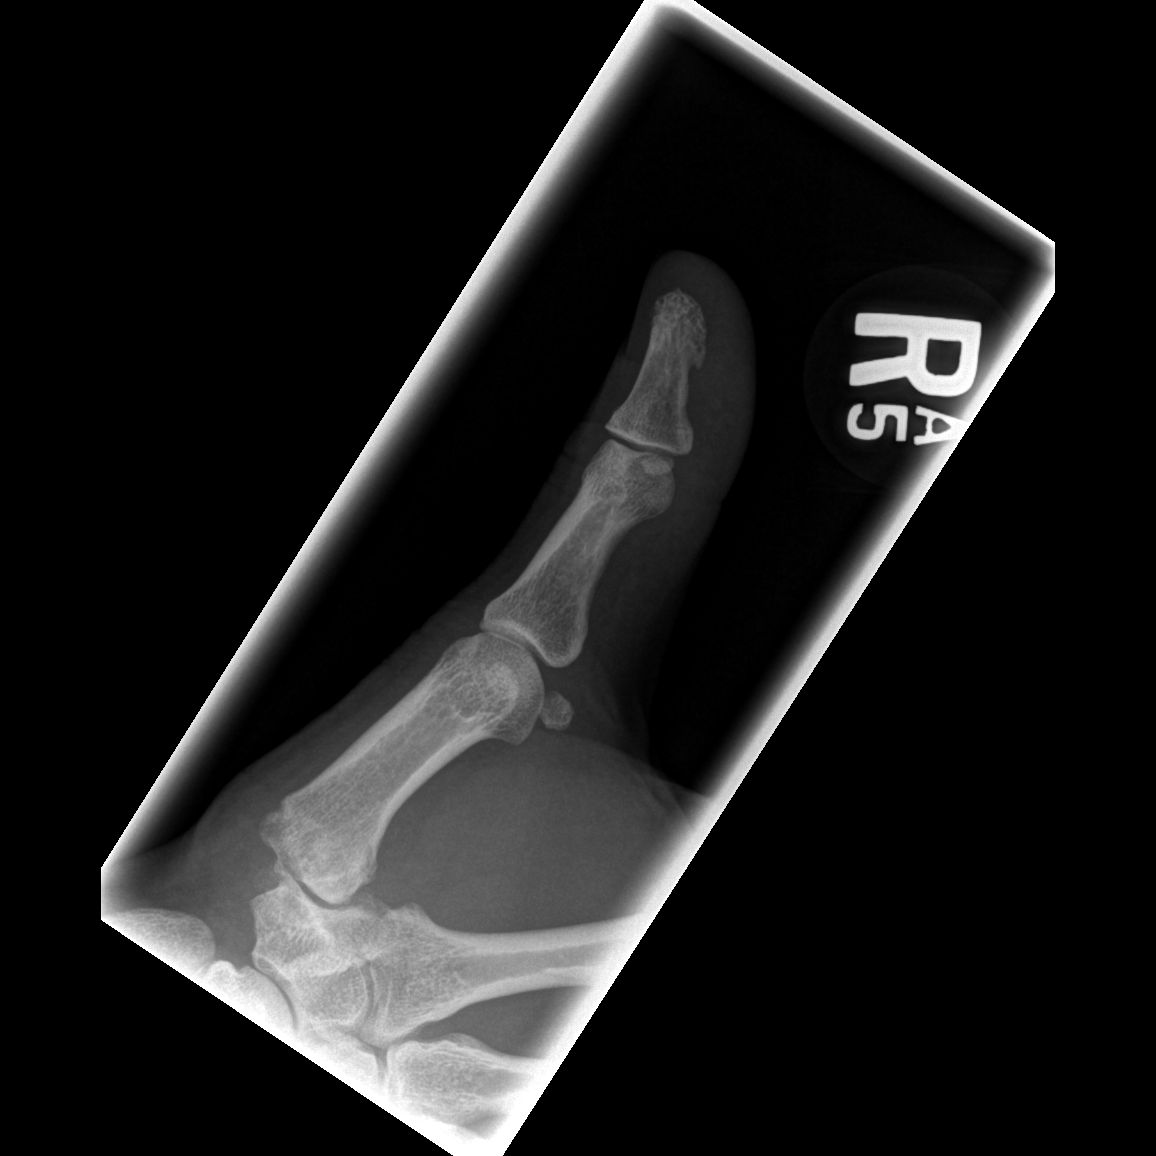

[x finger lateral right]
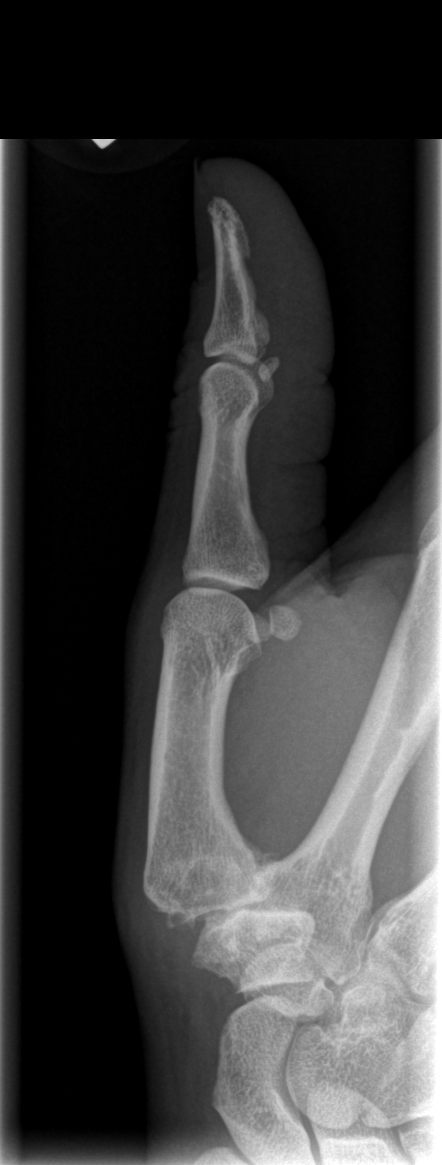

[3 of 3 positions shown; findings below may reference images not displayed]

FINDINGS: No acute fracture or dislocation. Severe osteoarthritis of the first
right CMC joint. No aggressive osseous lesion. Normal soft tissues.
IMPRESSION: Severe osteoarthritis of the first right CMC joint.

## 2018-10-05 IMAGING — CR DG FINGER THUMB 2+V*L*
3 series · 3 of 3 positions shown · non-contrast
Comparison: None.

CLINICAL DATA: Chronic bilateral thumb pain

EXAM:
LEFT THUMB 2+V

[x finger pa left]
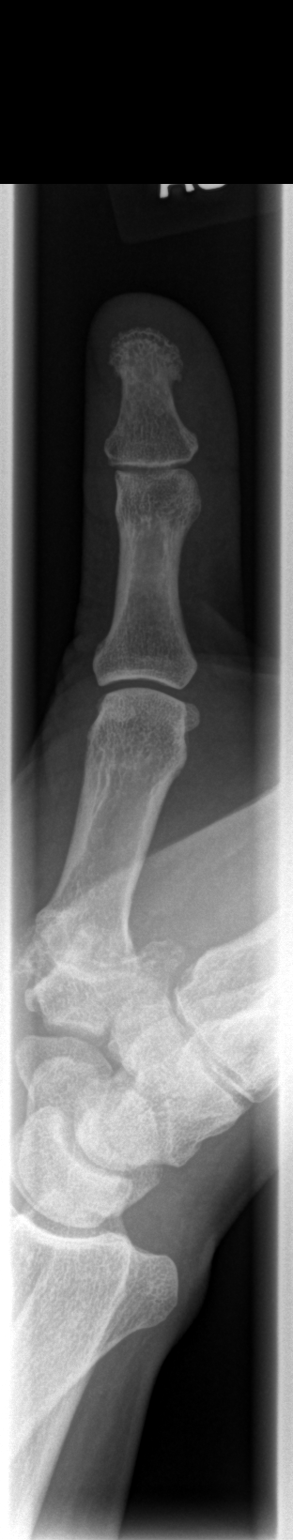

[x finger obl. left]
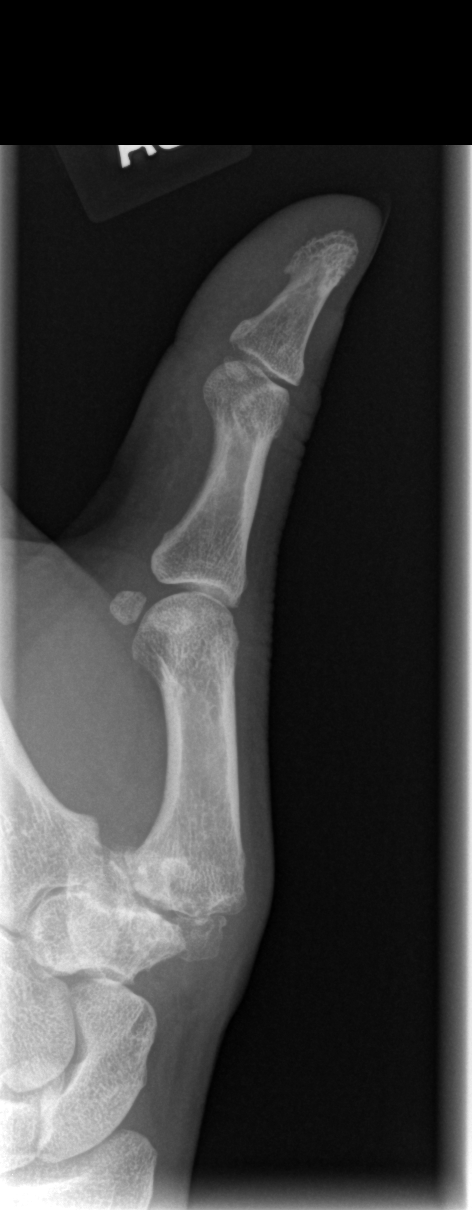

[x finger lateral left]
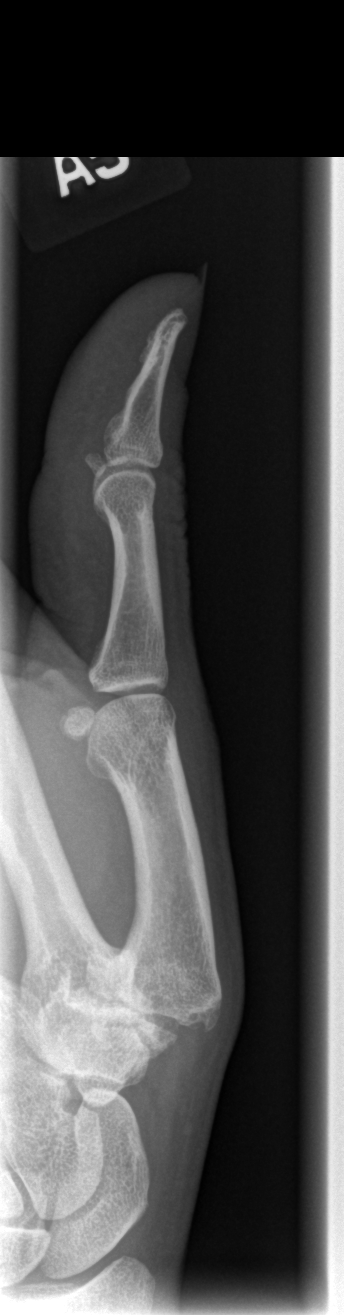

[3 of 3 positions shown; findings below may reference images not displayed]

FINDINGS: No acute fracture or dislocation. Severe osteoarthritis of the first
left CMC joint. No aggressive osseous lesion. Normal soft tissues.
IMPRESSION: Severe osteoarthritis of the first left CMC joint.

## 2019-08-02 ENCOUNTER — Other Ambulatory Visit: Payer: Self-pay

## 2019-08-02 ENCOUNTER — Ambulatory Visit (HOSPITAL_COMMUNITY)
Admission: EM | Admit: 2019-08-02 | Discharge: 2019-08-02 | Disposition: A | Discharge status: Home / Self Care | Payer: 59

## 2019-08-02 ENCOUNTER — Encounter (HOSPITAL_COMMUNITY): Payer: Self-pay | Admitting: *Deleted

## 2019-08-02 DIAGNOSIS — R030 Elevated blood-pressure reading, without diagnosis of hypertension: Secondary | ICD-10-CM

## 2019-08-02 DIAGNOSIS — R519 Headache, unspecified: Secondary | ICD-10-CM | POA: Diagnosis not present

## 2019-08-02 NOTE — ED Provider Notes (Signed)
[facility] [facility]    CSN: [id number] Arrival date & time: 08/02/19  1222      History   Chief Complaint Chief Complaint  Patient presents with  . Hypertension  . Palpitations    HPI Holly Robertson is a 54 y.o. female no contributing past medical history presenting today for evaluation of elevated blood pressure.  Patient notes that approximately 2 weeks ago she had a Covid scare in her family and was on the phone and noted she had a headache and some mild chest pressure.  Chest pressure resolved after this and she was tested negative for Covid.  She has continued to have intermittent headaches which are located on her right frontal area.  Has been using Tylenol which does relieve temporarily, but has had more frequent headaches than normal.  She checked her blood pressure last week and reports being in 010 systolic and 27O diastolic.  Yesterday she checked it again and noted blood pressure to be 180/110.  She is concerned about this elevation.  She denies any current chest pain.  Reports occasional sensations of fluttering.  Denies vision changes or difficulty speaking.  Denies weakness.  Denies leg swelling.  Has family history of elevated blood pressure.  Denies personal history of diabetes.   HPI  Past Medical History:  Diagnosis Date  . Tubal pregnancy     There are no problems to display for this patient.   Past Surgical History:  Procedure Laterality Date  . ROTATOR CUFF REPAIR Left   . SALPINGECTOMY      OB History   No obstetric history on file.      Home Medications    Prior to Admission medications   Medication Sig Start Date End Date Taking? Authorizing Provider  MULTIPLE VITAMINS PO Take by mouth.   Yes [provider]    Family History Family History  Problem Relation Age of Onset  . Diabetes Mother   . Hypertension Mother   . Hyperlipidemia Mother   . Cirrhosis Mother   . Sarcoidosis Mother     Social History Social History    Tobacco Use  . Smoking status: Never Smoker  . Smokeless tobacco: Never Used  Substance Use Topics  . Alcohol use: No  . Drug use: No     Allergies   Septra [sulfamethoxazole-trimethoprim]   Review of Systems Review of Systems  Constitutional: Negative for fatigue and fever.  HENT: Negative for congestion, sinus pressure and sore throat.   Eyes: Negative for photophobia, pain and visual disturbance.  Respiratory: Negative for cough and shortness of breath.   Cardiovascular: Positive for palpitations. Negative for chest pain and leg swelling.  Gastrointestinal: Negative for abdominal pain, nausea and vomiting.  Genitourinary: Negative for decreased urine volume and hematuria.  Musculoskeletal: Negative for myalgias, neck pain and neck stiffness.  Neurological: Positive for headaches. Negative for dizziness, syncope, facial asymmetry, speech difficulty, weakness, light-headedness and numbness.     Physical Exam Triage Vital Signs ED Triage Vitals  Enc Vitals Group     BP      Pulse      Resp      Temp      Temp src      SpO2      Weight      Height      Head Circumference      Peak Flow      Pain Score      Pain Loc      Pain  Edu?      Excl. in GC?    No data found.  Updated Vital Signs BP (!) 146/92   Pulse 69   Temp 98.2 F (36.8 C) (Oral)   Resp 16   LMP 02/21/2014   SpO2 99%   Visual Acuity Right Eye Distance:   Left Eye Distance:   Bilateral Distance:    Right Eye Near:   Left Eye Near:    Bilateral Near:     Physical Exam Vitals and nursing note reviewed.  Constitutional:      General: She is not in acute distress.    Appearance: She is well-developed.  HENT:     Head: Normocephalic and atraumatic.     Comments: Face symmetric    Ears:     Comments: Bilateral ears without tenderness to palpation of external auricle, tragus and mastoid, EAC's without erythema or swelling, TM's with good bony landmarks and cone of light. Non  erythematous.     Mouth/Throat:     Comments: Oral mucosa pink and moist, no tonsillar enlargement or exudate. Posterior pharynx patent and nonerythematous, no uvula deviation or swelling. Normal phonation.  Palate elevates symmetrically Eyes:     Extraocular Movements: Extraocular movements intact.     Conjunctiva/sclera: Conjunctivae normal.  Cardiovascular:     Rate and Rhythm: Normal rate and regular rhythm.     Heart sounds: No murmur.     Comments: No carotid bruits auscultated Pulmonary:     Effort: Pulmonary effort is normal. No respiratory distress.     Breath sounds: Normal breath sounds.     Comments: Breathing comfortably at rest, CTABL, no wheezing, rales or other adventitious sounds auscultated Abdominal:     Palpations: Abdomen is soft.     Tenderness: There is no abdominal tenderness.  Musculoskeletal:     Cervical back: Neck supple.     Comments: Bilateral lower leg symmetric and without calf tenderness swelling or erythema  Skin:    General: Skin is warm and dry.  Neurological:     General: No focal deficit present.     Mental Status: She is alert and oriented to person, place, and time. Mental status is at baseline.     Comments: Patient A&O x3, cranial nerves II-XII grossly intact, strength at shoulders, hips and knees 5/5, equal bilaterally, patellar reflex 2+ bilaterally.Gait without abnormality.      UC Treatments / Results  Labs (all labs ordered are listed, but only abnormal results are displayed) Labs Reviewed - No data to display  EKG   Radiology No results found.  Procedures Procedures (including critical care time)  Medications Ordered in UC Medications - No data to display  Initial Impression / Assessment and Plan / UC Course  I have reviewed the triage vital signs and the nursing notes.  Pertinent labs & imaging results that were available during my care of the patient were reviewed by me and considered in my medical decision making  (see chart for details).     Blood pressure 146/92.  EKG normal sinus rhythm with sinus arrhythmia, no acute signs of ischemia or infarction, no abnormal beats.  Currently asymptomatic today.  Recommended lifestyle changes with healthy diet and incorporating exercise.  Monitor blood pressure over the next 2 weeks and follow-up for blood pressure recheck with PCP in 2 weeks.  Discussed warning signs to follow-up in the emergency room.Discussed strict return precautions. Patient verbalized understanding and is agreeable with plan.  Final Clinical Impressions(s) / UC Diagnoses  Final diagnoses:  Elevated blood pressure reading in office without diagnosis of hypertension  Acute nonintractable headache, unspecified headache type     Discharge Instructions     Your blood pressure today was 146/92; please follow-up with your primary care in 2 weeks for blood pressure recheck/follow-up Please keep recordings of daily blood pressures to take to this visit In the meantime please work on lifestyle changes including healthier diet and incorporating more exercise-see attached Use anti-inflammatories for headache. You may take up to 800 mg Ibuprofen every 8 hours with food. You may supplement Ibuprofen with Tylenol [phone] every 8 hours.    Please follow-up if developing any worsening headache, vision changes, chest pain, shortness of breath, left arm numbness/pain, difficulty speaking, one-sided weakness    ED Prescriptions    None     PDMP not reviewed this encounter.   Lew Dawes, PA-C 08/02/19 1318

## 2019-08-02 NOTE — ED Triage Notes (Addendum)
Pt reports having slightly elevated BP a couple wks ago; yesterday states "I was feeling better, so I checked it again, and it was very high" - approx 180's/110s.  Today was normal at home.  Reports having intermittent HAs over past couple weeks.  Denies any vision changes. Also reports experiencing "fluttering" in chest this AM.

## 2019-08-02 NOTE — Discharge Instructions (Signed)
Your blood pressure today was 146/92; please follow-up with your primary care in 2 weeks for blood pressure recheck/follow-up Please keep recordings of daily blood pressures to take to this visit In the meantime please work on lifestyle changes including healthier diet and incorporating more exercise-see attached Use anti-inflammatories for headache. You may take up to 800 mg Ibuprofen every 8 hours with food. You may supplement Ibuprofen with Tylenol [phone] mg every 8 hours.    Please follow-up if developing any worsening headache, vision changes, chest pain, shortness of breath, left arm numbness/pain, difficulty speaking, one-sided weakness

## 2019-08-06 ENCOUNTER — Other Ambulatory Visit: Payer: Self-pay

## 2019-08-06 ENCOUNTER — Emergency Department (HOSPITAL_COMMUNITY)
Admission: EM | Admit: 2019-08-06 | Discharge: 2019-08-06 | Disposition: A | Discharge status: Home / Self Care | Payer: 59 | Attending: Emergency Medicine | Admitting: Emergency Medicine

## 2019-08-06 ENCOUNTER — Encounter (HOSPITAL_COMMUNITY): Payer: Self-pay

## 2019-08-06 ENCOUNTER — Emergency Department (HOSPITAL_COMMUNITY): Payer: 59

## 2019-08-06 DIAGNOSIS — Z79899 Other long term (current) drug therapy: Secondary | ICD-10-CM | POA: Diagnosis not present

## 2019-08-06 DIAGNOSIS — R0789 Other chest pain: Secondary | ICD-10-CM | POA: Diagnosis present

## 2019-08-06 DIAGNOSIS — R079 Chest pain, unspecified: Secondary | ICD-10-CM

## 2019-08-06 LAB — CBC
HCT: 39.1 % (ref 36.0–46.0)
Hemoglobin: 12.8 g/dL (ref 12.0–15.0)
MCH: 30.1 pg (ref 26.0–34.0)
MCHC: 32.7 g/dL (ref 30.0–36.0)
MCV: 92 fL (ref 80.0–100.0)
Platelets: 256 10*3/uL (ref 150–400)
RBC: 4.25 MIL/uL (ref 3.87–5.11)
RDW: 11.9 % (ref 11.5–15.5)
WBC: 5.4 10*3/uL (ref 4.0–10.5)
nRBC: 0 % (ref 0.0–0.2)

## 2019-08-06 LAB — BASIC METABOLIC PANEL
Anion gap: 12 (ref 5–15)
BUN: 11 mg/dL (ref 6–20)
CO2: 25 mmol/L (ref 22–32)
Calcium: 9.6 mg/dL (ref 8.9–10.3)
Chloride: 102 mmol/L (ref 98–111)
Creatinine, Ser: 0.95 mg/dL (ref 0.44–1.00)
GFR calc Af Amer: >60 mL/min (ref >60)
GFR calc non Af Amer: >60 mL/min (ref >60)
Glucose, Bld: 97 mg/dL (ref 70–99)
Potassium: 4.2 mmol/L (ref 3.5–5.1)
Sodium: 139 mmol/L (ref 135–145)

## 2019-08-06 LAB — TROPONIN I (HIGH SENSITIVITY)
Troponin I (High Sensitivity): <2 ng/L (ref <18)
Troponin I (High Sensitivity): 3 ng/L (ref <18)

## 2019-08-06 NOTE — ED Triage Notes (Signed)
Pt reports chest tightness and left arm tingling since this morning. Tingling has resolved but still having mild chest tightness. Pt seen at UC over the weekend for HTN. Pt a.o, nad noted

## 2019-08-06 NOTE — ED Notes (Signed)
Patient verbalizes understanding of discharge instructions. Opportunity for questioning and answers were provided. Armband removed by staff, pt discharged from ED to home via POV  

## 2019-08-06 NOTE — ED Provider Notes (Addendum)
[facility] Provider Note   CSN: [id number] Arrival date & time: 08/06/19  1533     History Chief Complaint  Patient presents with  . Chest Pain  . Tingling    Holly Robertson is a 54 y.o. female.  54 year old female who presents with intermittent chest tingling which last for seconds which began today.  No associated dyspnea or diaphoresis.  No recent fever cough or congestion.  Pain was at rest and seems to wax and wane.  Nothing seems to make it better or worse and no treatment used prior to arrival        Past Medical History:  Diagnosis Date  . Tubal pregnancy     There are no problems to display for this patient.   Past Surgical History:  Procedure Laterality Date  . ROTATOR CUFF REPAIR Left   . SALPINGECTOMY       OB History   No obstetric history on file.     Family History  Problem Relation Age of Onset  . Diabetes Mother   . Hypertension Mother   . Hyperlipidemia Mother   . Cirrhosis Mother   . Sarcoidosis Mother     Social History   Tobacco Use  . Smoking status: Never Smoker  . Smokeless tobacco: Never Used  Substance Use Topics  . Alcohol use: No  . Drug use: No    Home Medications Prior to Admission medications   Medication Sig Start Date End Date Taking? Authorizing Provider  MULTIPLE VITAMINS PO Take by mouth.    [provider]    Allergies    Septra [sulfamethoxazole-trimethoprim]  Review of Systems   Review of Systems  All other systems reviewed and are negative.   Physical Exam Updated Vital Signs BP 131/89   Pulse 69   Temp 98 F (36.7 C) (Oral)   Resp 10   Ht 1.727 m (5\' 8" )   Wt 80.7 kg   LMP 02/21/2014   SpO2 100%   BMI 27.06 kg/m   Physical Exam Vitals and nursing note reviewed.  Constitutional:      General: She is not in acute distress.    Appearance: Normal appearance. She is well-developed. She is not toxic-appearing.  HENT:     Head: Normocephalic  and atraumatic.  Eyes:     General: Lids are normal.     Conjunctiva/sclera: Conjunctivae normal.     Pupils: Pupils are equal, round, and reactive to light.  Neck:     Thyroid: No thyroid mass.     Trachea: No tracheal deviation.  Cardiovascular:     Rate and Rhythm: Normal rate and regular rhythm.     Heart sounds: Normal heart sounds. No murmur. No gallop.   Pulmonary:     Effort: Pulmonary effort is normal. No respiratory distress.     Breath sounds: Normal breath sounds. No stridor. No decreased breath sounds, wheezing, rhonchi or rales.  Abdominal:     General: Bowel sounds are normal. There is no distension.     Palpations: Abdomen is soft.     Tenderness: There is no abdominal tenderness. There is no rebound.  Musculoskeletal:        General: No tenderness. Normal range of motion.     Cervical back: Normal range of motion and neck supple.  Skin:    General: Skin is warm and dry.     Findings: No abrasion or rash.  Neurological:     Mental Status: She is  alert and oriented to person, place, and time.     GCS: GCS eye subscore is 4. GCS verbal subscore is 5. GCS motor subscore is 6.     Cranial Nerves: No cranial nerve deficit.     Sensory: No sensory deficit.  Psychiatric:        Speech: Speech normal.        Behavior: Behavior normal.     ED Results / Procedures / Treatments   Labs (all labs ordered are listed, but only abnormal results are displayed) Labs Reviewed  BASIC METABOLIC PANEL  CBC  TROPONIN I (HIGH SENSITIVITY)  TROPONIN I (HIGH SENSITIVITY)    EKG EKG Interpretation  Date/Time:  Wednesday August 06 2019 15:35:01 EDT Ventricular Rate:  70 PR Interval:  130 QRS Duration: 80 QT Interval:  416 QTC Calculation: 449 R Axis:   67 Text Interpretation: Normal sinus rhythm Septal infarct , age undetermined Abnormal ECG Confirmed by Lorre Nick ([id number]) on 08/06/2019 7:48:10 PM   Radiology DG Chest 2 View  Result Date: 08/06/2019 CLINICAL DATA:   Left-sided chest pain. EXAM: CHEST - 2 VIEW COMPARISON:  None. FINDINGS: The heart size and mediastinal contours are within normal limits. Both lungs are clear. The visualized skeletal structures are unremarkable. IMPRESSION: No active cardiopulmonary disease. Electronically Signed   By: Aram Candela M.D.   On: 08/06/2019 16:05    Procedures Procedures (including critical care time)  Medications Ordered in ED Medications - No data to display  ED Course  I have reviewed the triage vital signs and the nursing notes.  Pertinent labs & imaging results that were available during my care of the patient were reviewed by me and considered in my medical decision making (see chart for details).    MDM Rules/Calculators/A&P                      Patient is delta troponin here negative.  EKG without acute ischemic changes.  Chest x-ray per radiology.  Low suspicion for ACS.  Heart score of 1.  Will discharge home Final Clinical Impression(s) / ED Diagnoses Final diagnoses:  None    Rx / DC Orders ED Discharge Orders    None       Lorre Nick, MD 08/06/19 2009    Lorre Nick, MD 08/06/19 2009

## 2021-03-20 IMAGING — DX DG CHEST 2V
2 series · 2 of 2 positions shown · non-contrast
Comparison: None.

CLINICAL DATA: Left-sided chest pain.

EXAM:
CHEST - 2 VIEW

[chest pa]
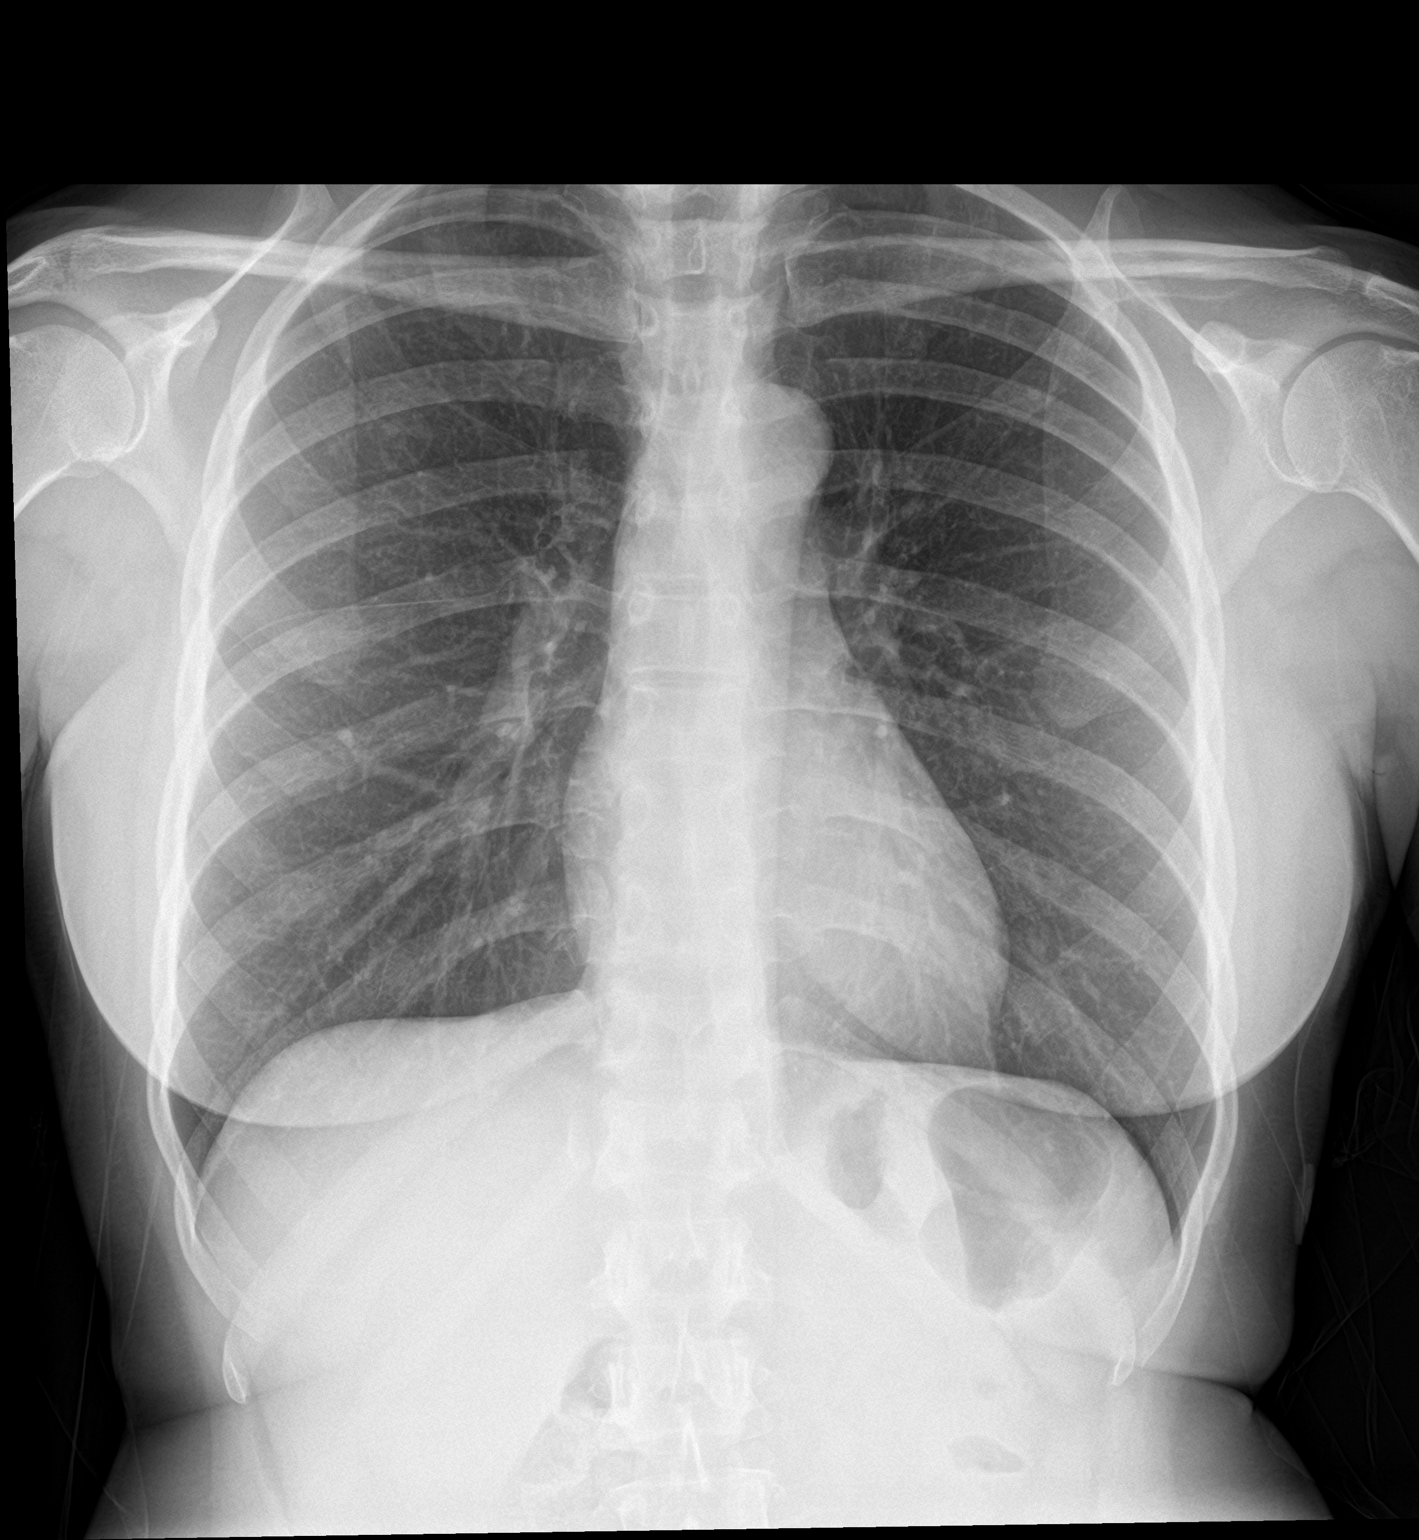

[chest lat]
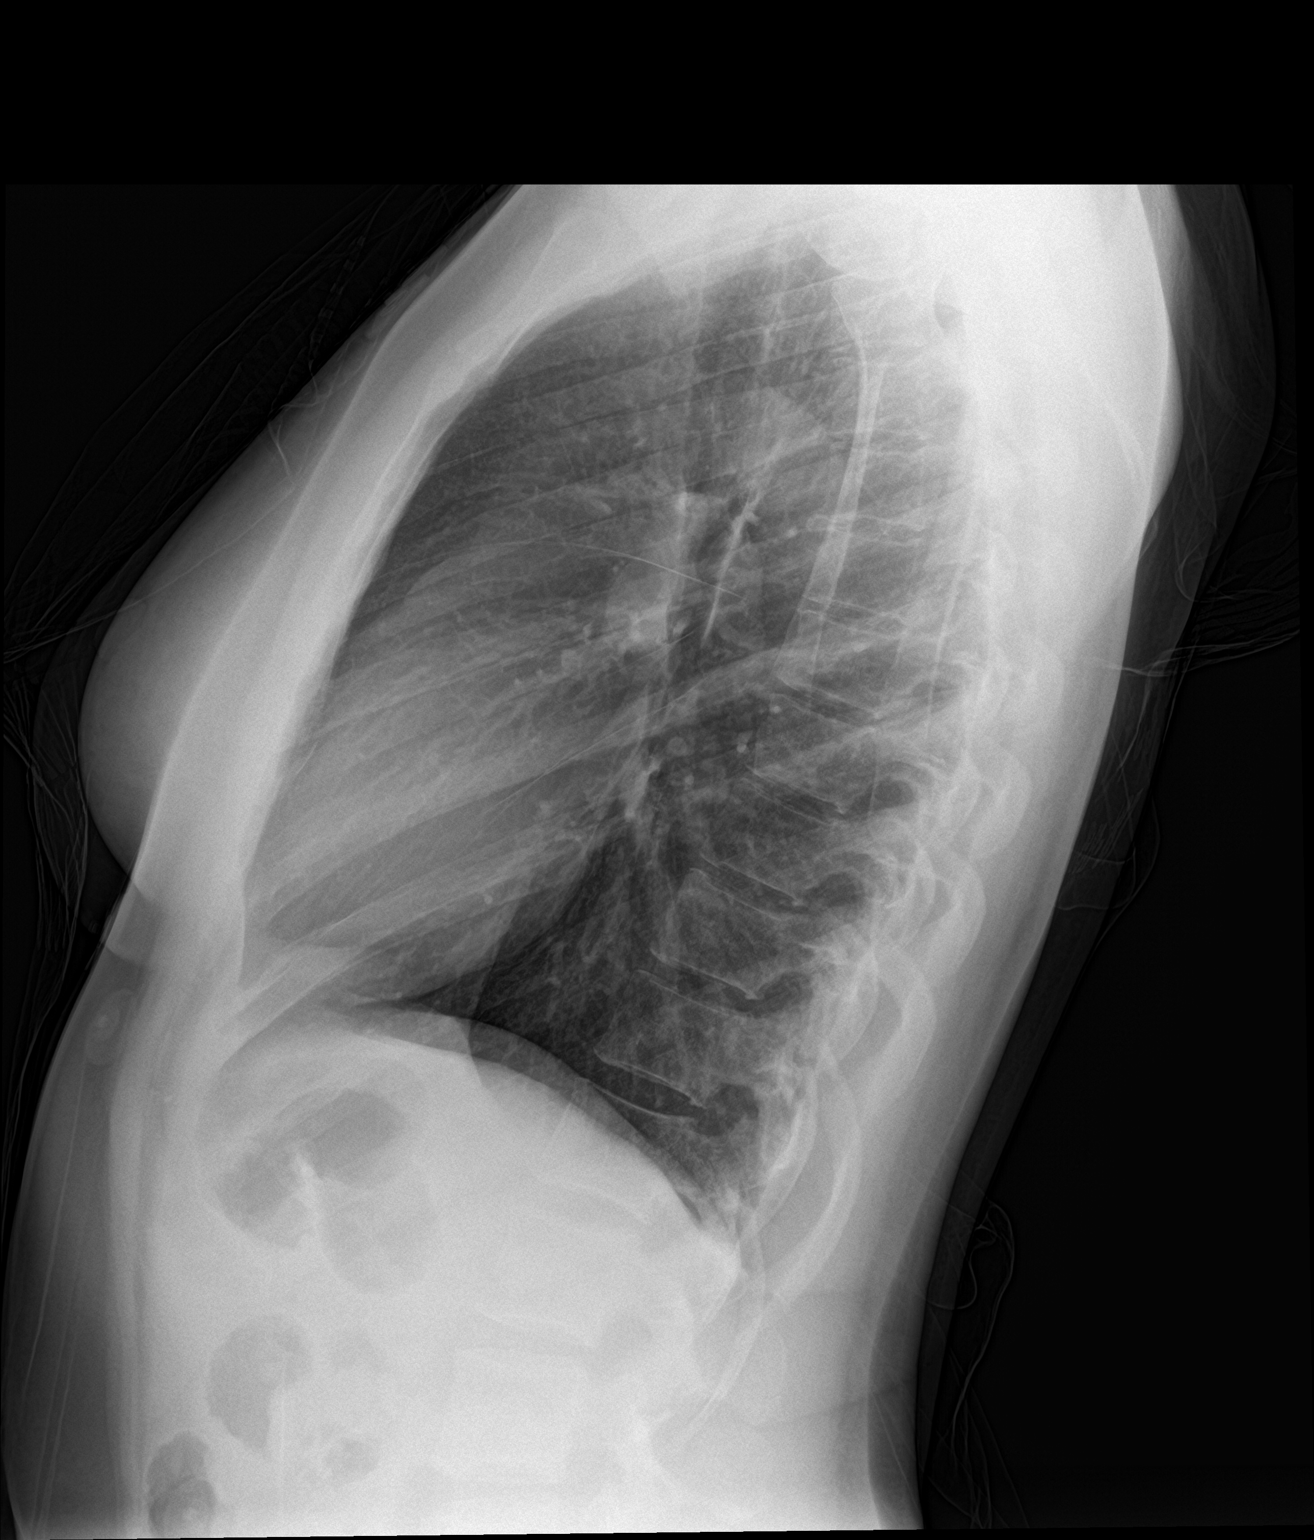

[2 of 2 positions shown; findings below may reference images not displayed]

FINDINGS: The heart size and mediastinal contours are within normal limits.
Both lungs are clear. The visualized skeletal structures are
unremarkable.
IMPRESSION: No active cardiopulmonary disease.

## 2022-01-15 ENCOUNTER — Ambulatory Visit (INDEPENDENT_AMBULATORY_CARE_PROVIDER_SITE_OTHER): Payer: 59

## 2022-01-15 ENCOUNTER — Ambulatory Visit (HOSPITAL_COMMUNITY)
Admission: EM | Admit: 2022-01-15 | Discharge: 2022-01-15 | Disposition: A | Discharge status: Home / Self Care | Payer: 59 | Attending: Emergency Medicine | Admitting: Emergency Medicine

## 2022-01-15 ENCOUNTER — Encounter (HOSPITAL_COMMUNITY): Payer: Self-pay

## 2022-01-15 DIAGNOSIS — M25522 Pain in left elbow: Secondary | ICD-10-CM

## 2022-01-15 MED ORDER — CYCLOBENZAPRINE HCL 10 MG PO TABS: 10.0000 mg | ORAL_TABLET | Freq: Every day | ORAL | 0 refills | Status: AC

## 2022-01-15 NOTE — Discharge Instructions (Addendum)
X-ray of your elbow is negative for injury to the bone and pain is most likely a result of direct recent injury today and soft tissue swelling which will resolve with time  Please continue use of ibuprofen as this will help to reduce inflammation and in turn will also help with your pain, may use Tylenol in addition to this to strengthen the affected  Use Flexeril at bedtime as needed to prevent stiffness in the morning, be mindful this medication may make you drowsy  May use ice or heat over the affected area in 10 to 15-minute intervals to help reduce swelling and for general comfort  Continue activity as tolerated  If symptoms persist to 2 weeks with no signs of relief you may follow-up with orthopedic specialist, information is listed on the front of your paperwork

## 2022-01-15 NOTE — ED Triage Notes (Signed)
Patient fell down the stairs today around 1:30 pm. Patient landed on the left arm and this has been aching. The elbow was hit and bent. Left elbow swollen with reduced range of motion.

## 2022-01-15 NOTE — ED Provider Notes (Signed)
[facility]    CSN: [id number] Arrival date & time: 01/15/22  1644      History   Chief Complaint Chief Complaint  Patient presents with   Elbow Injury    HPI Holly Robertson is a 56 y.o. female.   Patient presents with left elbow pain beginning today after fall downstairs.  Pain is felt with bending of the arm and initially had tingling to the fingertips but this has resolved.  Has attempted use of ibuprofen which has been helpful.   Past Medical History:  Diagnosis Date   Tubal pregnancy     There are no problems to display for this patient.   Past Surgical History:  Procedure Laterality Date   ROTATOR CUFF REPAIR Left    SALPINGECTOMY      OB History   No obstetric history on file.      Home Medications    Prior to Admission medications   Medication Sig Start Date End Date Taking? Authorizing Provider  atorvastatin (LIPITOR) 10 MG tablet Take 10 mg by mouth daily. 11/25/21  Yes [provider]  MULTIPLE VITAMINS PO Take by mouth.   Yes [provider]    Family History Family History  Problem Relation Age of Onset   Diabetes Mother    Hypertension Mother    Hyperlipidemia Mother    Cirrhosis Mother    Sarcoidosis Mother     Social History Social History   Tobacco Use   Smoking status: Never   Smokeless tobacco: Never  Vaping Use   Vaping Use: Never used  Substance Use Topics   Alcohol use: No   Drug use: No     Allergies   Septra [sulfamethoxazole-trimethoprim]   Review of Systems Review of Systems  Constitutional: Negative.   Respiratory: Negative.    Cardiovascular: Negative.   Musculoskeletal:  Positive for myalgias. Negative for arthralgias, back pain, gait problem, joint swelling, neck pain and neck stiffness.  Skin: Negative.   Neurological: Negative.      Physical Exam Triage Vital Signs ED Triage Vitals  Enc Vitals Group     BP 01/15/22 1650 129/82     Pulse Rate 01/15/22 1650 67     Resp  01/15/22 1650 16     Temp 01/15/22 1650 98.1 F (36.7 C)     Temp Source 01/15/22 1650 Oral     SpO2 01/15/22 1650 100 %     Weight 01/15/22 1649 188 lb (85.3 kg)     Height 01/15/22 1649 5\' 8"  (1.727 m)     Head Circumference --      Peak Flow --      Pain Score 01/15/22 1649 6     Pain Loc --      Pain Edu? --      Excl. in GC? --    No data found.  Updated Vital Signs BP 129/82 (BP Location: Right Arm)   Pulse 67   Temp 98.1 F (36.7 C) (Oral)   Resp 16   Ht 5\' 8"  (1.727 m)   Wt 188 lb (85.3 kg)   LMP 02/21/2014   SpO2 100%   BMI 28.59 kg/m   Visual Acuity Right Eye Distance:   Left Eye Distance:   Bilateral Distance:    Right Eye Near:   Left Eye Near:    Bilateral Near:     Physical Exam Constitutional:      Appearance: Normal appearance.  Eyes:     Extraocular Movements: Extraocular  movements intact.  Pulmonary:     Effort: Pulmonary effort is normal.  Musculoskeletal:     Comments: No tenderness, ecchymosis swelling or deformity is noted to the left epicondyle, mild tenderness is noted adjacent on the lateral aspect with mild to moderate swelling, no ecchymosis or deformity present to the lower arm, range of motion is intact, sensation is intact, 2+ brachial pulse  Neurological:     Mental Status: She is alert and oriented to person, place, and time. Mental status is at baseline.      UC Treatments / Results  Labs (all labs ordered are listed, but only abnormal results are displayed) Labs Reviewed - No data to display  EKG   Radiology DG ELBOW COMPLETE LEFT (3+VIEW)  Result Date: 01/15/2022 CLINICAL DATA:  Trauma, pain EXAM: LEFT ELBOW - COMPLETE 3+ VIEW COMPARISON:  None Available. FINDINGS: There is no evidence of fracture, dislocation, or joint effusion. There is no evidence of arthropathy or other focal bone abnormality. Soft tissues are unremarkable. IMPRESSION: No fracture or dislocation is seen in left elbow. Electronically Signed   By:  Ernie Avena M.D.   On: 01/15/2022 17:13    Procedures Procedures (including critical care time)  Medications Ordered in UC Medications - No data to display  Initial Impression / Assessment and Plan / UC Course  I have reviewed the triage vital signs and the nursing notes.  Pertinent labs & imaging results that were available during my care of the patient were reviewed by me and considered in my medical decision making (see chart for details).  Pain in the left elbow  X-ray is negative, discussed with patient, recommended continued use of over-the-counter analgesics and the RICE for outpatient management, prescribed Flexeril for bedtime, may continue activity as tolerated, given walker referral to orthopedics if symptoms persist past 2 weeks Final Clinical Impressions(s) / UC Diagnoses   Final diagnoses:  None   Discharge Instructions   None    ED Prescriptions   None    PDMP not reviewed this encounter.   Valinda Hoar, NP 01/15/22 1745

## 2022-09-05 ENCOUNTER — Other Ambulatory Visit: Payer: Self-pay | Admitting: Obstetrics

## 2022-09-05 DIAGNOSIS — R928 Other abnormal and inconclusive findings on diagnostic imaging of breast: Secondary | ICD-10-CM

## 2022-09-09 ENCOUNTER — Other Ambulatory Visit: Payer: Self-pay | Admitting: Obstetrics

## 2022-09-09 ENCOUNTER — Ambulatory Visit
Admission: RE | Admit: 2022-09-09 | Discharge: 2022-09-09 | Disposition: A | Discharge status: Home / Self Care | Payer: 59 | Source: Ambulatory Visit | Attending: Obstetrics | Admitting: Obstetrics

## 2022-09-09 DIAGNOSIS — N6310 Unspecified lump in the right breast, unspecified quadrant: Secondary | ICD-10-CM

## 2022-09-09 DIAGNOSIS — R928 Other abnormal and inconclusive findings on diagnostic imaging of breast: Secondary | ICD-10-CM

## 2022-09-14 ENCOUNTER — Other Ambulatory Visit: Payer: 59

## 2023-03-09 ENCOUNTER — Ambulatory Visit
Admission: RE | Admit: 2023-03-09 | Discharge: 2023-03-09 | Disposition: A | Discharge status: Home / Self Care | Payer: 59 | Source: Ambulatory Visit | Attending: Obstetrics | Admitting: Obstetrics

## 2023-03-09 ENCOUNTER — Other Ambulatory Visit: Payer: Self-pay | Admitting: Obstetrics

## 2023-03-09 DIAGNOSIS — R928 Other abnormal and inconclusive findings on diagnostic imaging of breast: Secondary | ICD-10-CM

## 2023-03-09 DIAGNOSIS — N6310 Unspecified lump in the right breast, unspecified quadrant: Secondary | ICD-10-CM

## 2023-09-07 ENCOUNTER — Ambulatory Visit
Admission: RE | Admit: 2023-09-07 | Discharge: 2023-09-07 | Disposition: A | Discharge status: Home / Self Care | Payer: 59 | Source: Ambulatory Visit | Attending: Obstetrics | Admitting: Obstetrics

## 2023-09-07 DIAGNOSIS — R928 Other abnormal and inconclusive findings on diagnostic imaging of breast: Secondary | ICD-10-CM

## 2023-09-07 DIAGNOSIS — N6310 Unspecified lump in the right breast, unspecified quadrant: Secondary | ICD-10-CM
